# Patient Record
Sex: Female | Born: 1975 | Race: Black or African American | Hispanic: No | Marital: Married | Smoking: Never smoker
Health system: Southern US, Community
[De-identification: ages and names within clinical notes are randomized; demographics above are authoritative.]

---

## 2012-08-10 ENCOUNTER — Ambulatory Visit: Payer: Self-pay | Admitting: Family Medicine

## 2012-08-10 VITALS — BP 121/86 | HR 77 | Temp 98.5°F | Resp 16 | Ht 64.0 in | Wt 154.0 lb

## 2012-08-10 DIAGNOSIS — Z0000 Encounter for general adult medical examination without abnormal findings: Secondary | ICD-10-CM

## 2012-08-10 DIAGNOSIS — Z0289 Encounter for other administrative examinations: Secondary | ICD-10-CM

## 2012-08-10 LAB — POCT CBC
Granulocyte percent: 36 %G — AB (ref 37–80)
HCT, POC: 37.4 % — AB (ref 37.7–47.9)
Hemoglobin: 11.5 g/dL — AB (ref 12.2–16.2)
Lymph, poc: 2 (ref 0.6–3.4)
MCH, POC: 27.8 pg (ref 27–31.2)
MCHC: 30.7 g/dL — AB (ref 31.8–35.4)
MCV: 90.6 fL (ref 80–97)
MID (cbc): 0.2 (ref 0–0.9)
MPV: 10.9 fL (ref 0–99.8)
POC Granulocyte: 1.3 — AB (ref 2–6.9)
POC LYMPH PERCENT: 57.4 %L — AB (ref 10–50)
POC MID %: 6.6 %M (ref 0–12)
Platelet Count, POC: 183 10*3/uL (ref 142–424)
RBC: 4.13 M/uL (ref 4.04–5.48)
RDW, POC: 15.3 %
WBC: 3.5 10*3/uL — AB (ref 4.6–10.2)

## 2012-08-10 LAB — COMPREHENSIVE METABOLIC PANEL
ALT: 44 U/L — ABNORMAL HIGH (ref 0–35)
AST: 38 U/L — ABNORMAL HIGH (ref 0–37)
Albumin: 4.5 g/dL (ref 3.5–5.2)
Alkaline Phosphatase: 72 U/L (ref 39–117)
BUN: 11 mg/dL (ref 6–23)
CO2: 25 mEq/L (ref 19–32)
Calcium: 9.2 mg/dL (ref 8.4–10.5)
Chloride: 102 mEq/L (ref 96–112)
Creat: 0.6 mg/dL (ref 0.50–1.10)
Glucose, Bld: 103 mg/dL — ABNORMAL HIGH (ref 70–99)
Potassium: 4.4 mEq/L (ref 3.5–5.3)
Sodium: 138 mEq/L (ref 135–145)
Total Bilirubin: 0.6 mg/dL (ref 0.3–1.2)
Total Protein: 7.2 g/dL (ref 6.0–8.3)

## 2012-08-10 LAB — RPR

## 2012-08-10 LAB — HIV ANTIBODY (ROUTINE TESTING W REFLEX): HIV: NONREACTIVE

## 2012-08-10 NOTE — Progress Notes (Signed)
Patient ID: Linden Mikes MRN: [id number], DOB: 12-16-1975, 37 y.o. Date of Encounter: 08/10/2012, 10:01 AM  Primary Physician: No primary provider on file.  Chief Complaint: Physical (CPE)  HPI: 37 y.o. y/o female with history of noted below here for CPE.  Doing well. No issues/complaints.   Review of Systems: Consitutional: No fever, chills, fatigue, night sweats, lymphadenopathy, or weight changes. Eyes: No visual changes, eye redness, or discharge. ENT/Mouth: Ears: No otalgia, tinnitus, hearing loss, discharge. Nose: No congestion, rhinorrhea, sinus pain, or epistaxis. Throat: No sore throat, post nasal drip, or teeth pain. Cardiovascular: No CP, palpitations, diaphoresis, DOE, edema, orthopnea, PND. Respiratory: No cough, hemoptysis, SOB, or wheezing. Gastrointestinal: No anorexia, dysphagia, reflux, pain, nausea, vomiting, hematemesis, diarrhea, constipation, BRBPR, or melena. Breast: No discharge, pain, swelling, or mass. Genitourinary: No dysuria, frequency, urgency, hematuria, incontinence, nocturia, amenorrhea, vaginal discharge, pruritis, burning, abnormal bleeding, or pain. Musculoskeletal: No decreased ROM, myalgias, stiffness, joint swelling, or weakness. Skin: No rash, erythema, lesion changes, pain, warmth, jaundice, or pruritis. Neurological: No headache, dizziness, syncope, seizures, tremors, memory loss, coordination problems, or paresthesias. Psychological: No anxiety, depression, hallucinations, SI/HI. Endocrine: No fatigue, polydipsia, polyphagia, polyuria, or known diabetes. All other systems were reviewed and are otherwise negative.  History reviewed. No pertinent past medical history.   History reviewed. No pertinent past surgical history.  Home Meds:  Prior to Admission medications   Not on File    Allergies: No Known Allergies  History   Social History  . Marital Status: Married    Spouse Name: N/A    Number of Children: N/A  . Years of  Education: N/A   Occupational History  . Not on file.   Social History Main Topics  . Smoking status: Never Smoker   . Smokeless tobacco: Not on file  . Alcohol Use: No  . Drug Use: No  . Sexually Active: Not on file   Other Topics Concern  . Not on file   Social History Narrative  . No narrative on file    History reviewed. No pertinent family history.  Physical Exam: Blood pressure 121/86, pulse 77, temperature 98.5 F (36.9 C), resp. rate 16, height 5\' 4"  (1.626 m), weight 154 lb (69.854 kg), last menstrual period 07/16/2012., Body mass index is 26.43 kg/(m^2). General: Well developed, well nourished, in no acute distress. HEENT: Normocephalic, atraumatic. Conjunctiva pink, sclera non-icteric. Pupils 2 mm constricting to 1 mm, round, regular, and equally reactive to light and accomodation. EOMI. Internal auditory canal clear. TMs with good cone of light and without pathology. Nasal mucosa pink. Nares are without discharge. No sinus tenderness. Oral mucosa pink. Dentition normal. Pharynx without exudate.   Neck: Supple. Trachea midline. No thyromegaly. Full ROM. No lymphadenopathy. Lungs: Clear to auscultation bilaterally without wheezes, rales, or rhonchi. Breathing is of normal effort and unlabored. Cardiovascular: RRR with S1 S2. No murmurs, rubs, or gallops appreciated. Distal pulses 2+ symmetrically. No carotid or abdominal bruits Abdomen: Soft, non-tender, non-distended with normoactive bowel sounds. No hepatosplenomegaly or masses. No rebound/guarding. No CVA tenderness. Without hernias.  Musculoskeletal: Full range of motion and 5/5 strength throughout. Without swelling, atrophy, tenderness, crepitus, or warmth. Extremities without clubbing, cyanosis, or edema. Calves supple. Skin: Warm and moist without erythema, ecchymosis, wounds, or rash. Neuro: A+Ox3. CN II-XII grossly intact. Moves all extremities spontaneously. Full sensation throughout. Normal gait. DTR 2+ throughout  upper and lower extremities. Finger to nose intact. Psych:  Responds to questions appropriately with a normal affect.  Assessment/Plan:  37 y.o. y/o female here for CPE -  Signed, Elvina Sidle, MD 08/10/2012 10:01 AM

## 2012-08-11 ENCOUNTER — Telehealth: Payer: Self-pay | Admitting: Radiology

## 2012-08-11 NOTE — Telephone Encounter (Signed)
Patient needs stool studies, malaria smear, Hep B Ag, Hep C Ab.to complete this form.  Please advise. Neyla Gauntt

## 2012-08-11 NOTE — Telephone Encounter (Signed)
None of these were done, not resulted on forms. Husband picked up the forms they are incomplete and can not be completed based on exam done.

## 2012-10-20 ENCOUNTER — Ambulatory Visit (INDEPENDENT_AMBULATORY_CARE_PROVIDER_SITE_OTHER): Payer: Self-pay | Admitting: Internal Medicine

## 2012-10-20 DIAGNOSIS — Z789 Other specified health status: Secondary | ICD-10-CM

## 2012-10-20 DIAGNOSIS — Z23 Encounter for immunization: Secondary | ICD-10-CM

## 2012-10-20 DIAGNOSIS — Z0000 Encounter for general adult medical examination without abnormal findings: Secondary | ICD-10-CM

## 2012-10-22 MED ORDER — CIPROFLOXACIN HCL 500 MG PO TABS: 500.0000 mg | ORAL_TABLET | Freq: Two times a day (BID) | ORAL | Status: DC

## 2012-10-22 MED ORDER — MEFLOQUINE HCL 250 MG PO TABS: 250.0000 mg | ORAL_TABLET | ORAL | Status: DC

## 2012-10-22 NOTE — Progress Notes (Signed)
  Subjective:    Patient ID: Erin Harvey, female    DOB: 1975/09/23, 37 y.o.   MRN: [id number]  HPI Erin Harvey is a 37yo F originally from [place], but living in [place] for the past 5 years who will be returning back to [place], [place] for 2 months, leaving on 10/27/12. She will be travelling with her 21 yo son and 40 yo daughter. Staying with family. Traveling to [place] but no other travel. She has many questions to whether we have TB/BCG vaccine to offer her children  All: nkma   Review of Systems     Objective:   Physical Exam        Assessment & Plan:  1) pre-travel vaccination = due to limited finances, she is receiving typhoid inj and yellow fever. Also recommend that she get hep A but she is unclear if she has had that vaccine.  2) traveler's diarrhea = will give rx for cipro if needed  3) malaria prophylaxis = will give larium, to take 1 tab by mouth weekly starting now, throughout her stay and 4 wks after her return.

## 2014-03-02 ENCOUNTER — Emergency Department (HOSPITAL_COMMUNITY)
Admission: EM | Admit: 2014-03-02 | Discharge: 2014-03-02 | Disposition: A | Discharge status: Home / Self Care | Payer: No Typology Code available for payment source | Attending: Emergency Medicine | Admitting: Emergency Medicine

## 2014-03-02 ENCOUNTER — Encounter (HOSPITAL_COMMUNITY): Payer: Self-pay | Admitting: Emergency Medicine

## 2014-03-02 DIAGNOSIS — S139XXA Sprain of joints and ligaments of unspecified parts of neck, initial encounter: Secondary | ICD-10-CM | POA: Diagnosis not present

## 2014-03-02 DIAGNOSIS — Y9389 Activity, other specified: Secondary | ICD-10-CM | POA: Insufficient documentation

## 2014-03-02 DIAGNOSIS — S199XXA Unspecified injury of neck, initial encounter: Secondary | ICD-10-CM

## 2014-03-02 DIAGNOSIS — S0993XA Unspecified injury of face, initial encounter: Secondary | ICD-10-CM | POA: Insufficient documentation

## 2014-03-02 DIAGNOSIS — Z792 Long term (current) use of antibiotics: Secondary | ICD-10-CM | POA: Insufficient documentation

## 2014-03-02 DIAGNOSIS — Y92410 Unspecified street and highway as the place of occurrence of the external cause: Secondary | ICD-10-CM | POA: Diagnosis not present

## 2014-03-02 DIAGNOSIS — S161XXA Strain of muscle, fascia and tendon at neck level, initial encounter: Secondary | ICD-10-CM

## 2014-03-02 MED ORDER — METHOCARBAMOL 500 MG PO TABS: 1000.0000 mg | ORAL_TABLET | Freq: Four times a day (QID) | ORAL | Status: AC

## 2014-03-02 MED ORDER — IBUPROFEN 600 MG PO TABS: 600.0000 mg | ORAL_TABLET | Freq: Four times a day (QID) | ORAL | Status: AC | PRN
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
## 2014-03-04 NOTE — ED Provider Notes (Signed)
Medical screening examination/treatment/procedure(s) were performed by non-physician practitioner and as supervising physician I was immediately available for consultation/collaboration.   EKG Interpretation None       Cassie Henkels, MD 03/04/14 0700 

## 2014-04-26 ENCOUNTER — Ambulatory Visit: Payer: Self-pay | Admitting: Internal Medicine

## 2014-09-14 ENCOUNTER — Other Ambulatory Visit: Payer: Self-pay | Admitting: Family Medicine

## 2014-09-14 DIAGNOSIS — E041 Nontoxic single thyroid nodule: Secondary | ICD-10-CM

## 2014-09-15 ENCOUNTER — Other Ambulatory Visit: Payer: Self-pay

## 2014-10-01 ENCOUNTER — Ambulatory Visit
Admission: RE | Admit: 2014-10-01 | Discharge: 2014-10-01 | Disposition: A | Discharge status: Home / Self Care | Payer: Medicaid Other | Source: Ambulatory Visit | Attending: Family Medicine | Admitting: Family Medicine

## 2014-10-01 DIAGNOSIS — E041 Nontoxic single thyroid nodule: Secondary | ICD-10-CM

## 2014-10-04 ENCOUNTER — Other Ambulatory Visit: Payer: Self-pay | Admitting: Family Medicine

## 2014-10-04 DIAGNOSIS — E042 Nontoxic multinodular goiter: Secondary | ICD-10-CM

## 2014-10-05 ENCOUNTER — Ambulatory Visit
Admission: RE | Admit: 2014-10-05 | Discharge: 2014-10-05 | Disposition: A | Discharge status: Home / Self Care | Payer: Medicaid Other | Source: Ambulatory Visit | Attending: Family Medicine | Admitting: Family Medicine

## 2014-10-05 ENCOUNTER — Other Ambulatory Visit (HOSPITAL_COMMUNITY)
Admission: RE | Admit: 2014-10-05 | Discharge: 2014-10-05 | Disposition: A | Discharge status: Home / Self Care | Payer: Medicaid Other | Source: Ambulatory Visit | Attending: Interventional Radiology | Admitting: Interventional Radiology

## 2014-10-05 DIAGNOSIS — E041 Nontoxic single thyroid nodule: Secondary | ICD-10-CM | POA: Insufficient documentation

## 2014-10-05 DIAGNOSIS — E042 Nontoxic multinodular goiter: Secondary | ICD-10-CM

## 2014-10-19 ENCOUNTER — Other Ambulatory Visit: Payer: Medicaid Other

## 2015-12-31 IMAGING — US US SOFT TISSUE HEAD/NECK
1 series · 14 of 25 positions shown · non-contrast
Comparison: None.

CLINICAL DATA: 39-year-old female with a history of thyroid nodules

EXAM:
THYROID ULTRASOUND
TECHNIQUE: Ultrasound examination of the thyroid gland and adjacent soft
tissues was performed.

[Series 1: us soft tissue head/neck · 0.06mm/px · 14 of 53 slices shown]
[im 1/53]
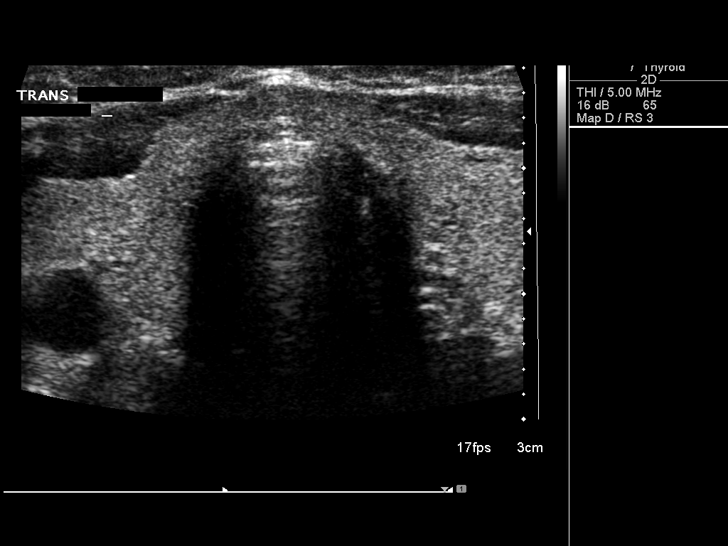
[im 5/53]
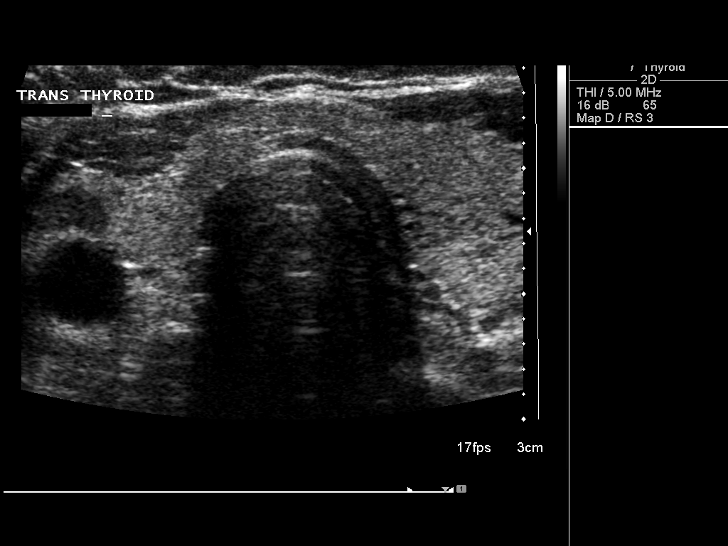
[im 9/53]
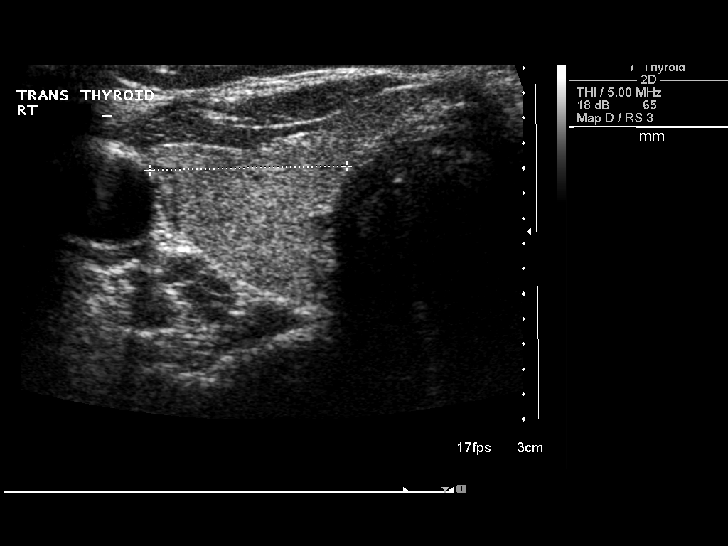
[im 14/53]
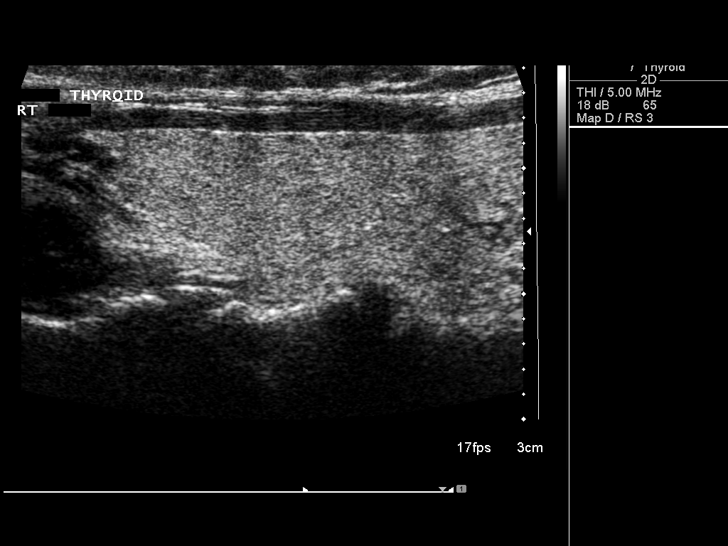
[im 18/53]
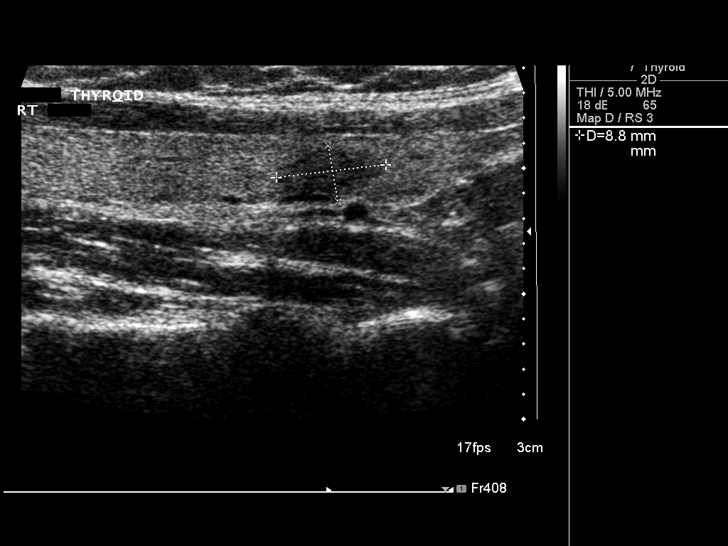
[im 20/53]
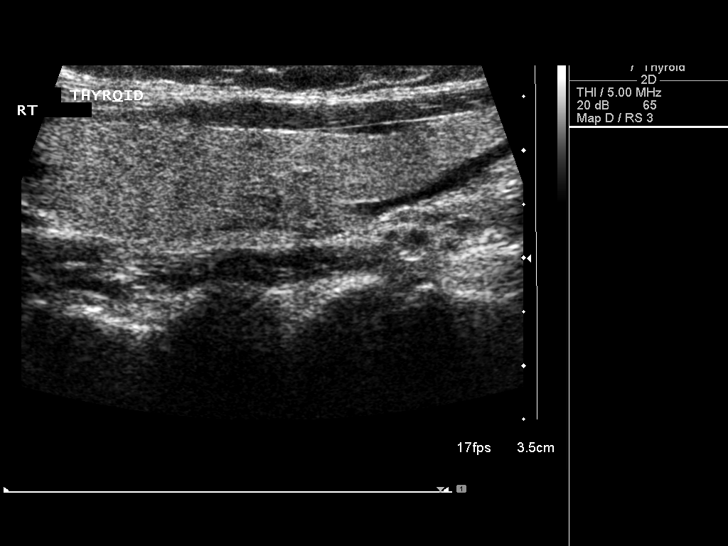
[im 24/53]
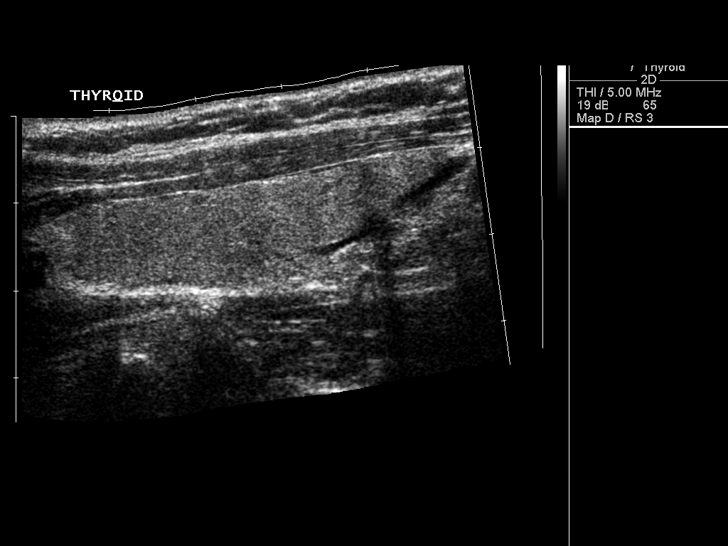
[im 29/53]
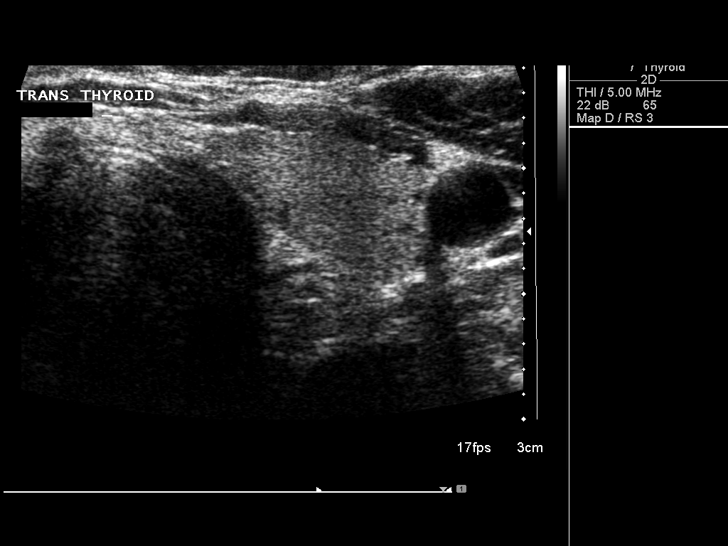
[im 33/53]
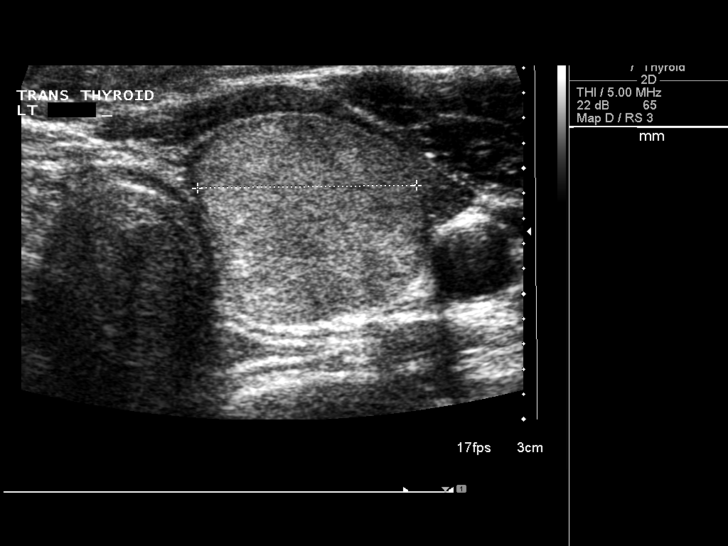
[im 35/53]
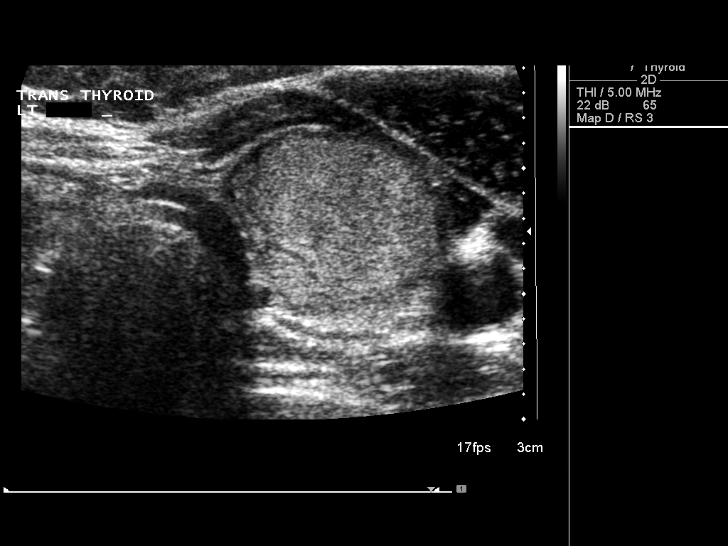
[im 40/53]
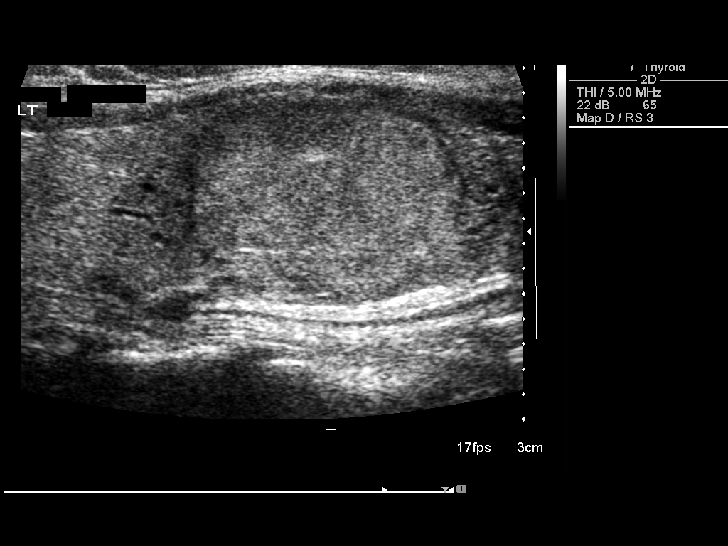
[im 44/53]
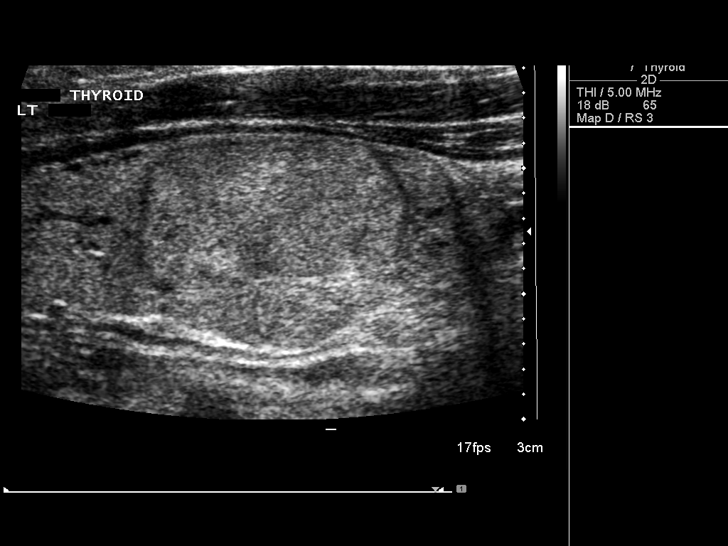
[im 48/53]
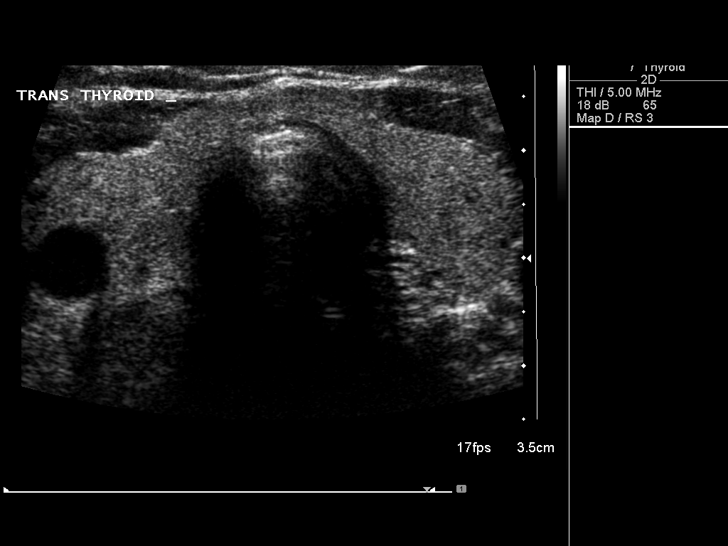
[im 53/53]
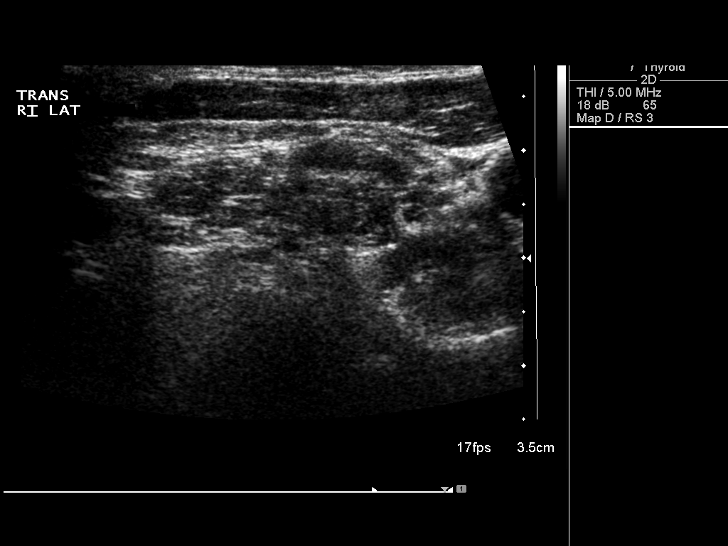

[14 of 25 positions shown; findings below may reference images not displayed]

FINDINGS: Right thyroid lobe

Measurements: 4.9 cm x 1.3 cm x 1.6 cm. Hypoechoic nodule at the
inferior right thyroid lobe measures 9 mm x 5 mm x 4 mm. No internal
calcifications.

Left thyroid lobe

Measurements: 4.9 cm x 1.7 cm x 2.1 cm. Dominant nodule on the left
measures 2.1 cm x 1.6 cm x 1.7 cm. No internal calcifications.
Well-defined margin, with increased internal flow.

Isthmus

Thickness: 3 mm.  No nodules visualized.

Lymphadenopathy

None visualized.
IMPRESSION: Multinodular thyroid. Dominant nodule on the left meets criteria for
biopsy.

Ultrasound-guided fine needle aspiration should be considered, as
per the consensus statement: Management of Thyroid Nodules Detected
at US: Society of Radiologists in Ultrasound Consensus Conference

## 2016-01-04 IMAGING — US US THYROID BIOPSY
1 series · 12 of 12 positions shown · non-contrast
Comparison: 10/01/2014

CLINICAL DATA: Dominant left thyroid solid nodule

EXAM:
ULTRASOUND GUIDED NEEDLE ASPIRATE BIOPSY OF THE THYROID GLAND

[Series 1: us thyroid biopsy · 0.07mm/px · 12 acquisitions, 12 frames shown]
[im 1/12]
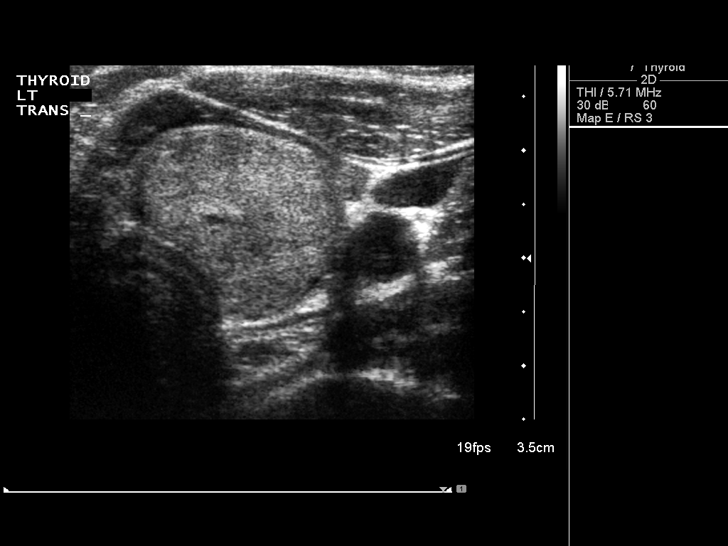
[im 2/12]
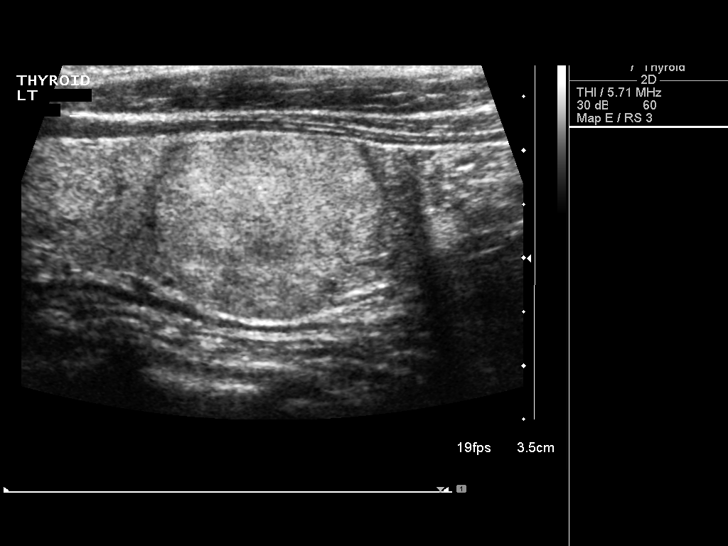
[im 3/12]
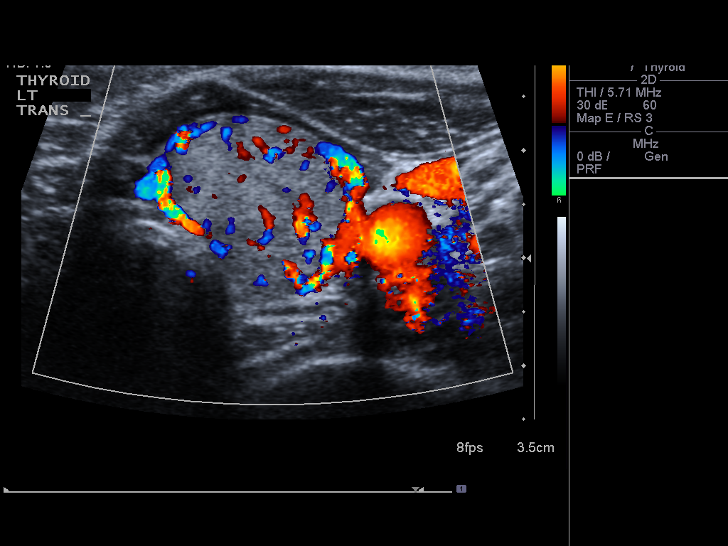
[im 4/12]
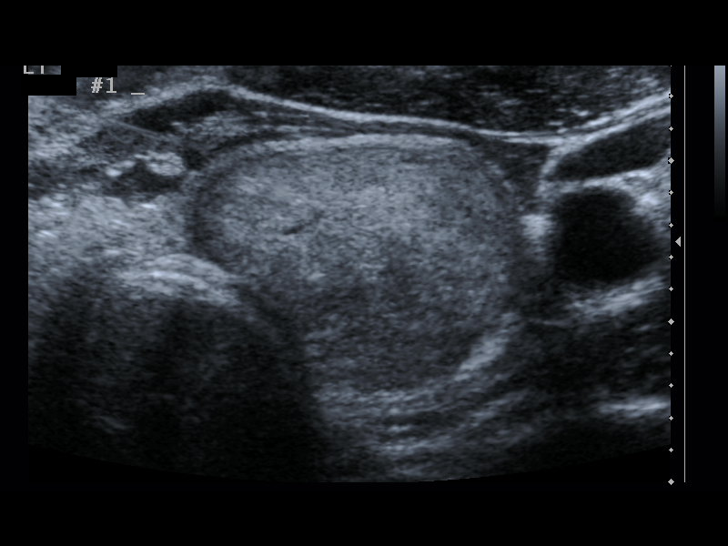
[im 5/12]
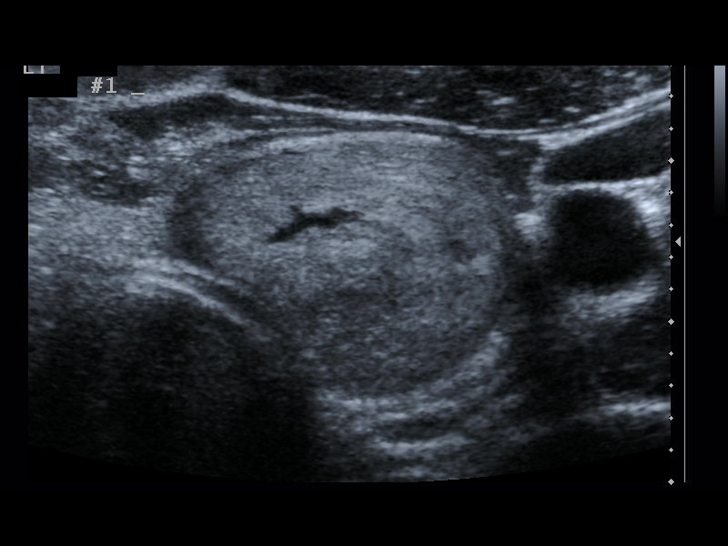
[im 6/12]
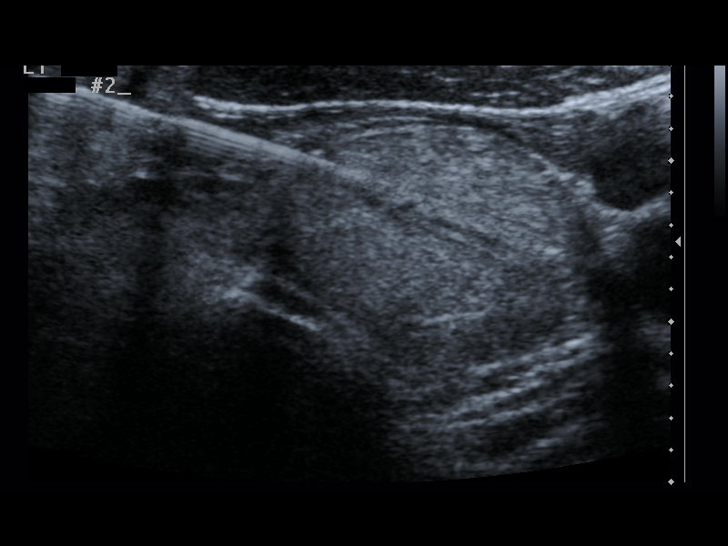
[im 7/12]
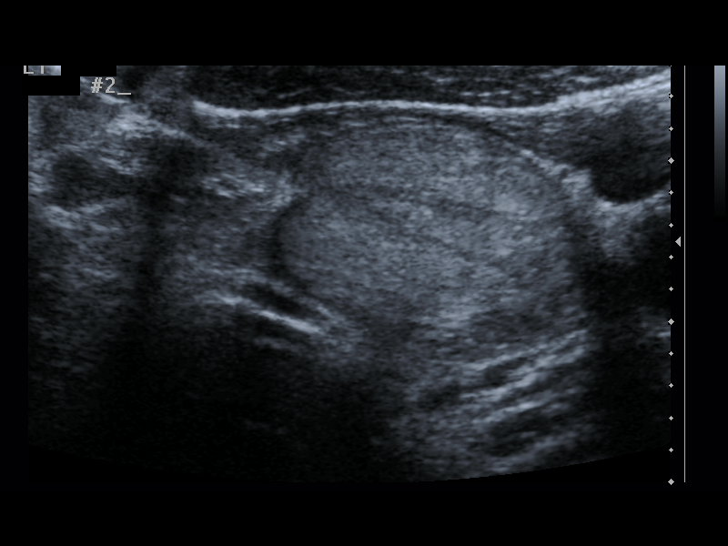
[im 8/12]
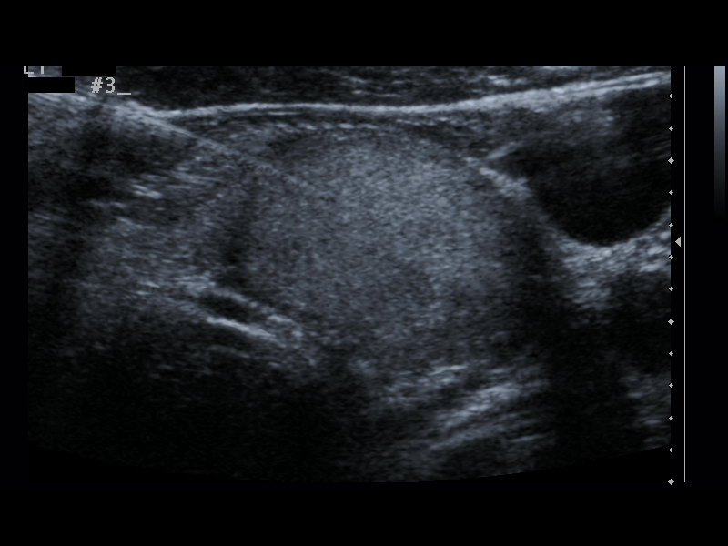
[im 9/12]
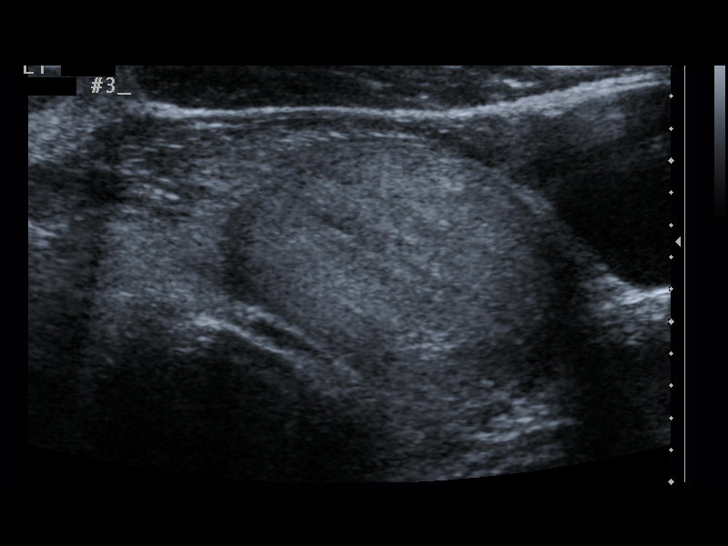
[im 10/12]
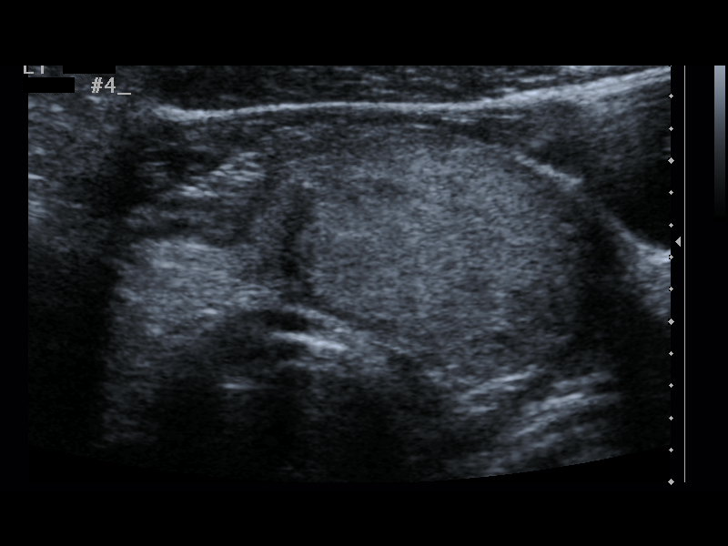
[im 11/12]
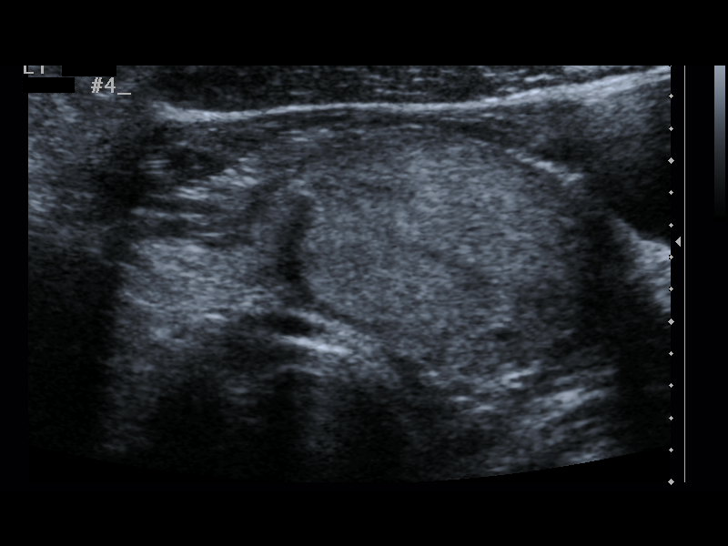
[im 12/12]
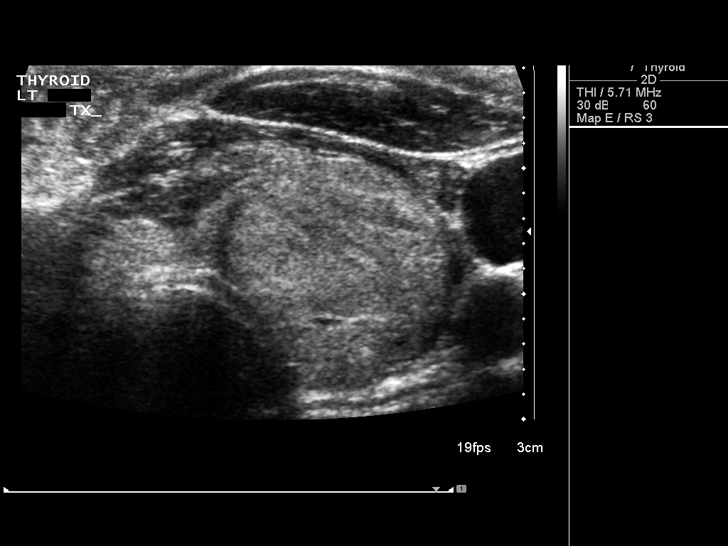

[12 of 12 positions shown; findings below may reference images not displayed]

PROCEDURE:
Thyroid biopsy was thoroughly discussed with the patient and
questions were answered. The benefits, risks, alternatives, and
complications were also discussed. The patient understands and
wishes to proceed with the procedure. Written consent was obtained.



Complications:  None immediate
FINDINGS: Imaging confirms needle placed in the dominant left thyroid nodule
IMPRESSION: Ultrasound guided needle aspirate biopsy performed of the dominant
left thyroid nodule.
